# Patient Record
Sex: Male | Born: 1985
Health system: Southern US, Community
[De-identification: ages and names within clinical notes are randomized; demographics above are authoritative.]

---
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
## 2009-02-23 ENCOUNTER — Emergency Department (HOSPITAL_COMMUNITY): Admission: EM | Admit: 2009-02-23 | Discharge: 2009-02-23 | Payer: Self-pay | Admitting: Emergency Medicine

## 2010-03-06 ENCOUNTER — Emergency Department (HOSPITAL_COMMUNITY): Admission: EM | Admit: 2010-03-06 | Discharge: 2010-03-06 | Payer: Self-pay | Admitting: Emergency Medicine

## 2011-10-06 ENCOUNTER — Emergency Department (HOSPITAL_COMMUNITY)
Admission: EM | Admit: 2011-10-06 | Discharge: 2011-10-06 | Disposition: A | Discharge status: Home / Self Care | Payer: Self-pay

## 2011-10-06 NOTE — ED Notes (Signed)
When NT entered room to obtain VS, pt stated he had a family emergency and could not stay to be treated. AMA/elopement form signed in EPIC.

## 2013-12-17 ENCOUNTER — Encounter (HOSPITAL_COMMUNITY): Payer: Self-pay | Admitting: Emergency Medicine

## 2013-12-17 ENCOUNTER — Emergency Department (HOSPITAL_COMMUNITY)
Admission: EM | Admit: 2013-12-17 | Discharge: 2013-12-17 | Disposition: A | Discharge status: Home / Self Care | Payer: No Typology Code available for payment source | Attending: Emergency Medicine | Admitting: Emergency Medicine

## 2013-12-17 DIAGNOSIS — M779 Enthesopathy, unspecified: Secondary | ICD-10-CM

## 2013-12-17 DIAGNOSIS — M25562 Pain in left knee: Secondary | ICD-10-CM

## 2013-12-17 DIAGNOSIS — Y92410 Unspecified street and highway as the place of occurrence of the external cause: Secondary | ICD-10-CM | POA: Insufficient documentation

## 2013-12-17 DIAGNOSIS — S99929A Unspecified injury of unspecified foot, initial encounter: Principal | ICD-10-CM

## 2013-12-17 DIAGNOSIS — S99919A Unspecified injury of unspecified ankle, initial encounter: Principal | ICD-10-CM

## 2013-12-17 DIAGNOSIS — M6580 Other synovitis and tenosynovitis, unspecified site: Secondary | ICD-10-CM | POA: Insufficient documentation

## 2013-12-17 DIAGNOSIS — F17200 Nicotine dependence, unspecified, uncomplicated: Secondary | ICD-10-CM | POA: Insufficient documentation

## 2013-12-17 DIAGNOSIS — Y9389 Activity, other specified: Secondary | ICD-10-CM | POA: Insufficient documentation

## 2013-12-17 DIAGNOSIS — Z79899 Other long term (current) drug therapy: Secondary | ICD-10-CM | POA: Insufficient documentation

## 2013-12-17 DIAGNOSIS — Z791 Long term (current) use of non-steroidal anti-inflammatories (NSAID): Secondary | ICD-10-CM | POA: Insufficient documentation

## 2013-12-17 DIAGNOSIS — S8990XA Unspecified injury of unspecified lower leg, initial encounter: Secondary | ICD-10-CM | POA: Insufficient documentation

## 2013-12-17 MED ORDER — IBUPROFEN 800 MG PO TABS: 800.0000 mg | ORAL_TABLET | Freq: Three times a day (TID) | ORAL | Status: DC

## 2013-12-17 MED ORDER — HYDROCODONE-ACETAMINOPHEN 5-325 MG PO TABS: 1.0000 | ORAL_TABLET | ORAL | Status: DC | PRN

## 2013-12-17 MED ORDER — CYCLOBENZAPRINE HCL 10 MG PO TABS: 10.0000 mg | ORAL_TABLET | Freq: Two times a day (BID) | ORAL | Status: DC | PRN
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
## 2013-12-17 NOTE — Discharge Instructions (Signed)
Cryotherapy °Cryotherapy means treatment with cold. Ice or gel packs can be used to reduce both pain and swelling. Ice is the most helpful within the first 24 to 48 hours after an injury or flareup from overusing a muscle or joint. Sprains, strains, spasms, burning pain, shooting pain, and aches can all be eased with ice. Ice can also be used when recovering from surgery. Ice is effective, has very few side effects, and is safe for most people to use. °PRECAUTIONS  °Ice is not a safe treatment option for people with: °· Raynaud's phenomenon. This is a condition affecting small blood vessels in the extremities. Exposure to cold may cause your problems to return. °· Cold hypersensitivity. There are many forms of cold hypersensitivity, including: °· Cold urticaria. Red, itchy hives appear on the skin when the tissues begin to warm after being iced. °· Cold erythema. This is a red, itchy rash caused by exposure to cold. °· Cold hemoglobinuria. Red blood cells break down when the tissues begin to warm after being iced. The hemoglobin that carry oxygen are passed into the urine because they cannot combine with blood proteins fast enough. °· Numbness or altered sensitivity in the area being iced. °If you have any of the following conditions, do not use ice until you have discussed cryotherapy with your caregiver: °· Heart conditions, such as arrhythmia, angina, or chronic heart disease. °· High blood pressure. °· Healing wounds or open skin in the area being iced. °· Current infections. °· Rheumatoid arthritis. °· Poor circulation. °· Diabetes. °Ice slows the blood flow in the region it is applied. This is beneficial when trying to stop inflamed tissues from spreading irritating chemicals to surrounding tissues. However, if you expose your skin to cold temperatures for too long or without the proper protection, you can damage your skin or nerves. Watch for signs of skin damage due to cold. °HOME CARE INSTRUCTIONS °Follow  these tips to use ice and cold packs safely. °· Place a dry or damp towel between the ice and skin. A damp towel will cool the skin more quickly, so you may need to shorten the time that the ice is used. °· For a more rapid response, add gentle compression to the ice. °· Ice for no more than 10 to 20 minutes at a time. The bonier the area you are icing, the less time it will take to get the benefits of ice. °· Check your skin after 5 minutes to make sure there are no signs of a poor response to cold or skin damage. °· Rest 20 minutes or more in between uses. °· Once your skin is numb, you can end your treatment. You can test numbness by very lightly touching your skin. The touch should be so light that you do not see the skin dimple from the pressure of your fingertip. When using ice, most people will feel these normal sensations in this order: cold, burning, aching, and numbness. °· Do not use ice on someone who cannot communicate their responses to pain, such as small children or people with dementia. °HOW TO MAKE AN ICE PACK °Ice packs are the most common way to use ice therapy. Other methods include ice massage, ice baths, and cryo-sprays. Muscle creams that cause a cold, tingly feeling do not offer the same benefits that ice offers and should not be used as a substitute unless recommended by your caregiver. °To make an ice pack, do one of the following: °· Place crushed ice or   a bag of frozen vegetables in a sealable plastic bag. Squeeze out the excess air. Place this bag inside another plastic bag. Slide the bag into a pillowcase or place a damp towel between your skin and the bag.  Mix 3 parts water with 1 part rubbing alcohol. Freeze the mixture in a sealable plastic bag. When you remove the mixture from the freezer, it will be slushy. Squeeze out the excess air. Place this bag inside another plastic bag. Slide the bag into a pillowcase or place a damp towel between your skin and the bag. SEEK MEDICAL  CARE IF:  You develop white spots on your skin. This may give the skin a blotchy (mottled) appearance.  Your skin turns blue or pale.  Your skin becomes waxy or hard.  Your swelling gets worse. MAKE SURE YOU:   Understand these instructions.  Will watch your condition.  Will get help right away if you are not doing well or get worse. Document Released: 04/26/2011 Document Revised: 11/22/2011 Document Reviewed: 04/26/2011 [facility] CenterExit[facility] Patient Information 2014 [place]ExitCare, [place]. Tendinitis Tendinitis is swelling and inflammation of the tendons. Tendons are band-like tissues that connect muscle to bone. Tendinitis commonly occurs in the:   Shoulders (rotator cuff).  Heels (Achilles tendon).  Elbows (triceps tendon). CAUSES Tendinitis is usually caused by overusing the tendon, muscles, and joints involved. When the tissue surrounding a tendon (synovium) becomes inflamed, it is called tenosynovitis. Tendinitis commonly develops in people whose jobs require repetitive motions. SYMPTOMS  Pain.  Tenderness.  Mild swelling. DIAGNOSIS Tendinitis is usually diagnosed by physical exam. Your caregiver may also order X-rays or other imaging tests. TREATMENT Your caregiver may recommend certain medicines or exercises for your treatment. HOME CARE INSTRUCTIONS   Use a sling or splint for as long as directed by your caregiver until the pain decreases.  Put ice on the injured area.  Put ice in a plastic bag.  Place a towel between your skin and the bag.  Leave the ice on for 15-20 minutes, 03-04 times a day.  Avoid using the limb while the tendon is painful. Perform gentle range of motion exercises only as directed by your caregiver. Stop exercises if pain or discomfort increase, unless directed otherwise by your caregiver.  Only take over-the-counter or prescription medicines for pain, discomfort, or fever as directed by your caregiver. SEEK MEDICAL CARE IF:   Your pain and swelling  increase.  You develop new, unexplained symptoms, especially increased numbness in the hands. MAKE SURE YOU:   Understand these instructions.  Will watch your condition.  Will get help right away if you are not doing well or get worse. Document Released: 08/27/2000 Document Revised: 11/22/2011 Document Reviewed: 11/16/2010 [facility]ExitCare Patient Information 2014 [place]ExitCare, [place]LLC.
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
## 2013-12-18 NOTE — ED Provider Notes (Signed)
Medical screening examination/treatment/procedure(s) were performed by non-physician practitioner and as supervising physician I was immediately available for consultation/collaboration.   Brinlyn Cena, MD 12/18/13 0816 

## 2013-12-19 ENCOUNTER — Emergency Department (HOSPITAL_COMMUNITY): Payer: No Typology Code available for payment source

## 2013-12-19 ENCOUNTER — Emergency Department (HOSPITAL_COMMUNITY)
Admission: EM | Admit: 2013-12-19 | Discharge: 2013-12-20 | Disposition: A | Discharge status: Home / Self Care | Payer: No Typology Code available for payment source | Attending: Emergency Medicine | Admitting: Emergency Medicine

## 2013-12-19 ENCOUNTER — Encounter (HOSPITAL_COMMUNITY): Payer: Self-pay | Admitting: Emergency Medicine

## 2013-12-19 DIAGNOSIS — F17200 Nicotine dependence, unspecified, uncomplicated: Secondary | ICD-10-CM | POA: Insufficient documentation

## 2013-12-19 DIAGNOSIS — Y92410 Unspecified street and highway as the place of occurrence of the external cause: Secondary | ICD-10-CM | POA: Insufficient documentation

## 2013-12-19 DIAGNOSIS — S99929A Unspecified injury of unspecified foot, initial encounter: Principal | ICD-10-CM

## 2013-12-19 DIAGNOSIS — IMO0002 Reserved for concepts with insufficient information to code with codable children: Secondary | ICD-10-CM | POA: Insufficient documentation

## 2013-12-19 DIAGNOSIS — Y9389 Activity, other specified: Secondary | ICD-10-CM | POA: Insufficient documentation

## 2013-12-19 DIAGNOSIS — S8990XA Unspecified injury of unspecified lower leg, initial encounter: Secondary | ICD-10-CM | POA: Insufficient documentation

## 2013-12-19 DIAGNOSIS — S99919A Unspecified injury of unspecified ankle, initial encounter: Principal | ICD-10-CM

## 2013-12-19 DIAGNOSIS — M25562 Pain in left knee: Secondary | ICD-10-CM
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
## 2013-12-19 NOTE — ED Notes (Signed)
Pt arrived to room at this time
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
## 2013-12-20 NOTE — ED Notes (Signed)
Pt discharged with significant other.

## 2013-12-21 NOTE — ED Provider Notes (Signed)
Medical screening examination/treatment/procedure(s) were performed by non-physician practitioner and as supervising physician I was immediately available for consultation/collaboration.   EKG Interpretation None       Jorel Gravlin, MD 12/21/13 0645 

## 2014-01-01 ENCOUNTER — Emergency Department (HOSPITAL_COMMUNITY)
Admission: EM | Admit: 2014-01-01 | Discharge: 2014-01-02 | Disposition: A | Discharge status: Home / Self Care | Payer: No Typology Code available for payment source | Attending: Emergency Medicine | Admitting: Emergency Medicine

## 2014-01-01 DIAGNOSIS — Z87828 Personal history of other (healed) physical injury and trauma: Secondary | ICD-10-CM | POA: Insufficient documentation

## 2014-01-01 DIAGNOSIS — F17200 Nicotine dependence, unspecified, uncomplicated: Secondary | ICD-10-CM | POA: Insufficient documentation

## 2014-01-01 DIAGNOSIS — Z0279 Encounter for issue of other medical certificate: Secondary | ICD-10-CM | POA: Insufficient documentation

## 2014-01-01 DIAGNOSIS — Z0000 Encounter for general adult medical examination without abnormal findings: Secondary | ICD-10-CM
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
## 2014-01-02 ENCOUNTER — Encounter (HOSPITAL_COMMUNITY): Payer: Self-pay | Admitting: Emergency Medicine

## 2014-01-02 NOTE — ED Notes (Signed)
Pt A&OX4, ambulatory at discharge with slow, steady gait, verbalizing no complaints at this time.  

## 2014-01-02 NOTE — Discharge Instructions (Signed)
- Return to the emergency department if you develop any changing/worsening condition or any other concerns  Emergency Department Screening Exam A medical screening exam helps find the cause of your problem. This exam also helps determine if your problem requires treatment right away. Your exam has shown that you do not require immediate emergency treatment. It is safe for you to go to your caregiver's office or clinic for treatment. Plans may have been made for you to be seen by your regular caregiver today. Patients must be treated in an emergency department regardless of their ability to pay. You can decide to stay and receive continued treatment in the emergency department. Some insurance plans may not cover the cost of this service. In some, but not all, states in the [place] the hospital and/or your caregiver might bill you directly for your evaluation and treatment in the emergency department. If your condition worsens or changes in any way, return for re-evaluation or you may go to your caregiver's office or clinic for treatment. Document Released: 11/26/2008 Document Revised: 11/22/2011 Document Reviewed: 11/26/2008 [facility] Patient Information 2014 [place], [place].   Health Maintenance, Males A healthy lifestyle and preventative care can promote health and wellness.  Maintain regular health, dental, and eye exams.  Eat a healthy diet. Foods like vegetables, fruits, whole grains, low-fat dairy products, and lean protein foods contain the nutrients you need and are low in calories. Decrease your intake of foods high in solid fats, added sugars, and salt. Get information about a proper diet from your health care provider, if necessary.  Regular physical exercise is one of the most important things you can do for your health. Most adults should get at least 150 minutes of moderate-intensity exercise (any activity that increases your heart rate and causes you to sweat) each week. In addition, most  adults need muscle-strengthening exercises on 2 or more days a week.   Maintain a healthy weight. The body mass index (BMI) is a screening tool to identify possible weight problems. It provides an estimate of body fat based on height and weight. Your health care provider can find your BMI and can help you achieve or maintain a healthy weight. For males 20 years and older:  A BMI below 18.5 is considered underweight.  A BMI of 18.5 to 24.9 is normal.  A BMI of 25 to 29.9 is considered overweight.  A BMI of 30 and above is considered obese.  Maintain normal blood lipids and cholesterol by exercising and minimizing your intake of saturated fat. Eat a balanced diet with plenty of fruits and vegetables. Blood tests for lipids and cholesterol should begin at age 69 and be repeated every 5 years. If your lipid or cholesterol levels are high, you are over 50, or you are at high risk for heart disease, you may need your cholesterol levels checked more frequently.Ongoing high lipid and cholesterol levels should be treated with medicines, if diet and exercise are not working.  If you smoke, find out from your health care provider how to quit. If you do not use tobacco, do not start.  Lung cancer screening is recommended for adults aged 31 80 years who are at high risk for developing lung cancer because of a history of smoking. A yearly low-dose CT scan of the lungs is recommended for people who have at least a 30-pack-year history of smoking and are a current smoker or have quit within the past 15 years. A pack year of smoking is smoking an  average of 1 pack of cigarettes a day for 1 year (for example, a 30-pack-year history of smoking could mean smoking 1 pack a day for 30 years or 2 packs a day for 15 years). Yearly screening should continue until the smoker has stopped smoking for at least 15 years. Yearly screening should be stopped for people who develop a health problem that would prevent them from  having lung cancer treatment.  If you choose to drink alcohol, do not have more than 2 drinks per day. One drink is considered to be 12 oz (360 mL) of beer, 5 oz (150 mL) of wine, or 1.5 oz (45 mL) of liquor.  Avoid use of street drugs. Do not share needles with anyone. Ask for help if you need support or instructions about stopping the use of drugs.  High blood pressure causes heart disease and increases the risk of stroke. Blood pressure should be checked at least every 1 2 years. Ongoing high blood pressure should be treated with medicines if weight loss and exercise are not effective.  If you are 25 28 years old, ask your health care provider if you should take aspirin to prevent heart disease.  Diabetes screening involves taking a blood sample to check your fasting blood sugar level. This should be done once every 3 years after age 13, if you are at a normal weight and without risk factors for diabetes. Testing should be considered at a younger age or be carried out more frequently if you are overweight and have at least 1 risk factor for diabetes.  Colorectal cancer can be detected and often prevented. Most routine colorectal cancer screening begins at the age of 33 and continues through age 44. However, your health care provider may recommend screening at an earlier age if you have risk factors for colon cancer. On a yearly basis, your health care provider may provide home test kits to check for hidden blood in the stool. A small camera at the end of a tube may be used to directly examine the colon (sigmoidoscopy or colonoscopy) to detect the earliest forms of colorectal cancer. Talk to your health care provider about this at age 62, when routine screening begins. A direct exam of the colon should be repeated every 5 10 years through age 61, unless early forms of pre-cancerous polyps or small growths are found.  People who are at an increased risk for hepatitis B should be screened for this  virus. You are considered at high risk for hepatitis B if:  You were born in a country where hepatitis B occurs often. Talk with your health care provider about which countries are considered high-risk.  Your parents were born in a high-risk country and you have not received a shot to protect against hepatitis B (hepatitis B vaccine).  You have HIV or AIDS.  You use needles to inject street drugs.  You live with, or have sex with, someone who has hepatitis B.  You are a man who has sex with other men (MSM).  You get hemodialysis treatment.  You take certain medicines for conditions like cancer, organ transplantation, and autoimmune conditions.  Hepatitis C blood testing is recommended for all people born from [birth year] through 1965 and any individual with known risk factors for hepatitis C.  Healthy men should no longer receive prostate-specific antigen (PSA) blood tests as part of routine cancer screening. Talk to your health care provider about prostate cancer screening.  Testicular cancer screening is not recommended  for adolescents or adult males who have no symptoms. Screening includes self-exam, a health care provider exam, and other screening tests. Consult with your health care provider about any symptoms you have or any concerns you have about testicular cancer.  Practice safe sex. Use condoms and avoid high-risk sexual practices to reduce the spread of sexually transmitted infections (STIs).  Use sunscreen. Apply sunscreen liberally and repeatedly throughout the day. You should seek shade when your shadow is shorter than you. Protect yourself by wearing long sleeves, pants, a wide-brimmed hat, and sunglasses year round, whenever you are outdoors.  Tell your health care provider of new moles or changes in moles, especially if there is a change in shape or color. Also tell your provider if a mole is larger than the size of a pencil eraser.  A one-time screening for abdominal aortic  aneurysm (AAA) and surgical repair of large AAAs by ultrasound is recommended for men aged 28 75 years who are current or former smokers.  Stay current with your vaccines (immunizations). Document Released: 02/26/2008 Document Revised: 06/20/2013 Document Reviewed: 01/25/2011 [facility] Patient Information 2014 [place], [place].   Emergency Department Resource Guide 1) Find a Doctor and Pay Out of Pocket Although you won't have to find out who is covered by your insurance plan, it is a good idea to ask around and get recommendations. You will then need to call the office and see if the doctor you have chosen will accept you as a new patient and what types of options they offer for patients who are self-pay. Some doctors offer discounts or will set up payment plans for their patients who do not have insurance, but you will need to ask so you aren't surprised when you get to your appointment.  2) Contact Your Local Health Department Not all health departments have doctors that can see patients for sick visits, but many do, so it is worth a call to see if yours does. If you don't know where your local health department is, you can check in your phone book. The CDC also has a tool to help you locate your state's health department, and many state websites also have listings of all of their local health departments.  3) Find a Walk-in Clinic If your illness is not likely to be very severe or complicated, you may want to try a walk in clinic. These are popping up all over the country in pharmacies, drugstores, and shopping centers. They're usually staffed by nurse practitioners or physician assistants that have been trained to treat common illnesses and complaints. They're usually fairly quick and inexpensive. However, if you have serious medical issues or chronic medical problems, these are probably not your best option.  No Primary Care Doctor: - Call Health Connect at  [phone] - they can help you locate  a primary care doctor that  accepts your insurance, provides certain services, etc. - Physician Referral Service- [phone]  Chronic Pain Problems: Organization         Address  Phone   Notes  [facility]  [phone] Patients need to be referred by their primary care doctor.   Medication Assistance: Organization         Address  Phone   Notes  [facility] Medication [facility] [address] [place], [place] [address] [phone]) [phone] --Must be a resident of [place] [facility] -- Must have NO insurance coverage whatsoever (no Medicaid/ Medicare, etc.) -- The pt. MUST have a  primary care doctor that directs their care regularly and follows them in the community   [facility]ist  [phone]   [employer]  [phone]    Agencies that provide inexpensive medical care: Organization         Address  Phone   Notes  [facility]  [phone]) [phone]   [facility]    [phone]) [phone]   [facility] [address] [place], [place] [address] [phone]) [phone]   [facility] [address], [place] [phone]   [facility]anned [facility]    [phone]   [facility]    [phone]   [facility] and [facility]  [address], [place] Phone:  [phone], Fax:  [phone]) [phone] Hours of Operation:  9 am - 6 pm, M-F.  Also accepts Medicaid/Medicare and self-pay.  [facility]  [address], [place] Phone: [phone](336) [phone], Fax: [phone](336) [phone]. Hours of Operation:  8:30 am - 5:30 pm, M-F.  Also accepts Medicaid and self-pay.  [facility] [place] [address], [place] Phone: [phone](336) [phone]   [facility] [address], [place], [place] [phone], Ext. [phone] Mondays & Thursdays: 7-9 AM.  First 15 patients are seen on a first come, first serve basis.    Medicaid-accepting [facility]  Providers:  Organization         Address  Phone   Notes  [facility] [address], [place] [phone]) [phone] Also accepts self-pay patients.  [facility] [address], [place]  [phone](336) [phone]   [facility] [address], [place] [phone]   [facility] [address] [place] [address], [place] [phone](336) [phone]   Renaye RakersVeita Bland [address], [place]   [phone]) [phone] Only accepts [place] Access [place] patients after they have their name applied to their card.   Self-Pay (no insurance) in [facility] [place]:  Organization         Address  Phone   Notes  Sickle Cell Patients, [facility] [address], [place] [phone]) [phone]   [facility] [address], [place] [phone]   [facility] [place]  [address],  [phone]) [phone]   [facility]/Dr. Osei-Bonsu  [address] [place] [address], [place] or [phone], [place] [phone] Phone number for both [place] and [place] locations is the same.  [facility] [address], [place] [phone]) [phone]   [facility]  [address] [place] [address], [place] or [address] [phone]   [facility] [address], [place] [phone]) [phone], phone; [phone](336) [phone], fax Sees patients 1st and 3rd Saturday of every month.  Must not qualify for public or private insurance (i.e. Medicaid, Medicare, [place] Health Choice, Veterans' Benefits)  Household income should be no more than 200% of the poverty level The clinic cannot treat you if you are pregnant or think you are pregnant  Sexually transmitted diseases are not treated at the clinic.    Dental Care: Organization         Address  Phone  Notes  [facility] [address], [place] [phone] Accepts children up to age 79 who are enrolled in Medicaid or [place] Health Choice; pregnant women with a Medicaid card; and children who have applied for Medicaid or [place] Health Choice, but were declined, whose parents can pay a reduced fee at time of service.  [facility] of [facility]  [address], [place] [phone] Accepts children up to age 59 who are enrolled in [place] or [place] Health Choice; pregnant women with a Medicaid card; and children who have applied for Medicaid or  Health Choice, but were declined, whose parents can pay a reduced fee at time of service.  [facility]  [address] [place], [place] [phone] Patients are seen by appointment only. Walk-ins are not accepted. [facility] will see patients 7 years of age and older. Monday - Tuesday (8am-5pm) Most Wednesdays (8:30-5pm) $30 per visit, cash only  [facility] Adult Dental Access PROGRAM  [address], [facility] [phone] Patients are seen by appointment only. Walk-ins are not accepted. [facility] will see patients 46 years of age and older. One Wednesday Evening (Monthly: Volunteer Based).  $30 per visit, cash only  [employer]  [phone] for adults; Children under age 58, call Graduate Pediatric Dentistry at [phone]. Children aged 78-14, please call [phone] to request a pediatric application.  Dental services are provided in all areas of dental care including fillings, crowns and bridges, complete and partial dentures, implants, gum treatment, root canals, and extractions. Preventive care is also provided. Treatment is provided to both adults and children. Patients are selected via a lottery and there is often a waiting list.   [facility] [address] [place]  [phone] [email or web address]   [facility]  [address] [place], [place] [phone], Ext. [phone] Second and Fourth Thursday of each month, opens at 6:30 AM; Clinic ends at 9 AM.  Patients are seen on a first-come first-served basis, and a limited number are seen during each clinic.   [facility]  [address] [place], [place] [phone]   Eligibility Requirements You must have lived in [place], [place], or [place] counties for at least the last three months.   You cannot be eligible for state or federal sponsored [place], including [employer], [place], or [employer].   You generally cannot be eligible for healthcare insurance through your employer.    How to apply: Eligibility screenings are held every Tuesday and Wednesday afternoon from 1:00 pm until 4:00 pm. You do not need an appointment for the interview!  [facility] [address] [place], [place] [phone]   [facility] Health Department  [phone]   [facility] Health Department  [phone]   [facility] Health Department  [phone]    Behavioral Health Resources in the Community: Intensive Outpatient Programs Organization         Address  Phone  Notes  [facility] [address] [place], [place] [phone]   [facility]patient [address] [place], [place] [phone]   ADS: Alcohol & Drug Svcs [address], [place], [place]  [phone]   [facility] Mental Health [address],  [place], [place] [phone] or [phone]   Substance Abuse Resources Organization         Address  Phone  Notes  Alcohol and Drug Services  [phone]  [facility]  [phone]   [facility] [place]  [phone]   Floydene Flock  [phone]   Residential & Outpatient Substance Abuse Program  [phone]   Psychological Services Organization         Address  Phone  Notes  [facility]al Health  [phone]     [facility]  [phone]   [facility] [address] [place] [phone] or [phone]    Mobile Crisis Teams Organization         Address  Phone  Notes  [facility], Mobile Crisis Care Unit  [phone]   [facility]  [address] [place], [place] [phone]   Doristine Locks [address] [place] [phone]    Self-Help/Support Groups Organization         Address  Phone             Notes  [facility] [place] - variety of support groups  [phone] Call for more information  Narcotics Anonymous (NA), [facility] [address], [employer] [place]  2 meetings at this location   Statistician         Address  Phone  Notes  [facility] [address],    [place]  [phone]   [facility]  [address] [place] [address], [place], [place] [phone]   [facility] [address] [place], [place] [phone] Admissions: 8am-3pm M-F  [facility] [address]    [place], [place] [phone]   [facility] [address] [place], [place], [place] [phone]   [facility] [address]  [place], [place] [phone]   [facility] - Intensive Outpatient [address], [place], [place] [phone]   [facility] ([facility] [address]  [place], [place] [phone] or [phone]   [facility] ([facility]) [address], [place], [place] [phone] Accepts Medicaid  [facility] [place] [address],  [place] [phone] Substance Abuse/Addiction Treatment   [facility] Organization         Address  Phone  Notes  [facility]  [phone]   Angie Fava, PhD [address] [place], [place]   [phone] or [phone]    [facility] Behavioral   [address] [place], [place] [phone]   [facility] [address] [place], [place] [phone] Insurance/Medicaid/sponsorship through [facility] and Families [address]                                    [place], [place] [phone] Therapy/tele-psych/case  [facility] [address] [place], [place] [phone]    Dr. Lolly Mustache  [phone]   [facility]  [employer] [facility] Dept. 1) [address] [place] 2) [address] [place] 3)  [address], [place] [phone]  [phone]   [facility] Child Abuse Hotline [phone]) [phone] or [phone] (After Hours)
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
## 2014-01-14 NOTE — ED Provider Notes (Addendum)
Medical screening examination/treatment/procedure(s) were performed by non-physician practitioner and as supervising physician I was immediately available for consultation/collaboration.   EKG Interpretation None       Derwood KaplanAnkit Printice Hellmer, MD 01/14/14 1746  Derwood KaplanAnkit Skyra Crichlow, MD 01/15/14 1859

## 2018-05-02 ENCOUNTER — Emergency Department (HOSPITAL_BASED_OUTPATIENT_CLINIC_OR_DEPARTMENT_OTHER): Payer: No Typology Code available for payment source

## 2018-05-02 ENCOUNTER — Emergency Department (HOSPITAL_BASED_OUTPATIENT_CLINIC_OR_DEPARTMENT_OTHER)
Admission: EM | Admit: 2018-05-02 | Discharge: 2018-05-02 | Disposition: A | Discharge status: Home / Self Care | Payer: No Typology Code available for payment source | Attending: Emergency Medicine | Admitting: Emergency Medicine

## 2018-05-02 ENCOUNTER — Other Ambulatory Visit: Payer: Self-pay

## 2018-05-02 ENCOUNTER — Encounter (HOSPITAL_BASED_OUTPATIENT_CLINIC_OR_DEPARTMENT_OTHER): Payer: Self-pay | Admitting: *Deleted

## 2018-05-02 DIAGNOSIS — Y9389 Activity, other specified: Secondary | ICD-10-CM | POA: Insufficient documentation

## 2018-05-02 DIAGNOSIS — Y92410 Unspecified street and highway as the place of occurrence of the external cause: Secondary | ICD-10-CM | POA: Diagnosis not present

## 2018-05-02 DIAGNOSIS — L739 Follicular disorder, unspecified: Secondary | ICD-10-CM | POA: Diagnosis not present

## 2018-05-02 DIAGNOSIS — S39012A Strain of muscle, fascia and tendon of lower back, initial encounter: Secondary | ICD-10-CM | POA: Diagnosis not present

## 2018-05-02 DIAGNOSIS — R21 Rash and other nonspecific skin eruption: Secondary | ICD-10-CM | POA: Diagnosis not present

## 2018-05-02 DIAGNOSIS — Y998 Other external cause status: Secondary | ICD-10-CM | POA: Insufficient documentation

## 2018-05-02 DIAGNOSIS — S3992XA Unspecified injury of lower back, initial encounter: Secondary | ICD-10-CM | POA: Diagnosis present

## 2018-05-02 DIAGNOSIS — F17210 Nicotine dependence, cigarettes, uncomplicated: Secondary | ICD-10-CM | POA: Insufficient documentation

## 2018-05-02 MED ORDER — CYCLOBENZAPRINE HCL 10 MG PO TABS: 10.0000 mg | ORAL_TABLET | Freq: Two times a day (BID) | ORAL | 0 refills | Status: AC | PRN

## 2018-05-02 MED ORDER — NAPROXEN 500 MG PO TABS: 500.0000 mg | ORAL_TABLET | Freq: Two times a day (BID) | ORAL | 0 refills | Status: AC

## 2018-05-02 MED ORDER — DOXYCYCLINE HYCLATE 100 MG PO CAPS: 100.0000 mg | ORAL_CAPSULE | Freq: Two times a day (BID) | ORAL | 0 refills | Status: AC

## 2018-05-02 MED ORDER — LIDOCAINE 5 % EX PTCH: 1.0000 | MEDICATED_PATCH | CUTANEOUS | 0 refills | Status: AC

## 2018-05-02 MED FILL — CYCLOBENZAPRINE HCL 10 MG T: 10 | 10 days supply | Qty: 20 | Fill #0

## 2018-05-02 MED FILL — NAPROXEN 500 MG TABLET: 500 | 15 days supply | Qty: 30 | Fill #0

## 2018-05-02 MED FILL — DOXYCYCLINE HYCLATE 100 MG: 100 | 7 days supply | Qty: 14 | Fill #0
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
## 2018-05-02 NOTE — Discharge Instructions (Signed)
Naprosyn, Flexeril for pain and muscle spasms.  Try lidocaine patches for pain control.  Take doxycycline for folliculitis until all gone.  Try antibacterial soap.  Follow-up with family doctor as needed.  Return if worsening symptoms.
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
## 2019-05-22 ENCOUNTER — Other Ambulatory Visit: Payer: Self-pay

## 2019-05-22 DIAGNOSIS — Z20822 Contact with and (suspected) exposure to covid-19: Secondary | ICD-10-CM

## 2019-05-24 ENCOUNTER — Telehealth: Payer: Self-pay | Admitting: *Deleted

## 2019-05-24 LAB — NOVEL CORONAVIRUS, NAA: SARS-CoV-2, NAA: NOT DETECTED

## 2019-05-24 NOTE — Telephone Encounter (Signed)
See results note 05/24/19.

## 2019-08-08 ENCOUNTER — Other Ambulatory Visit: Payer: Self-pay

## 2019-08-08 DIAGNOSIS — Z20822 Contact with and (suspected) exposure to covid-19: Secondary | ICD-10-CM

## 2019-08-09 LAB — NOVEL CORONAVIRUS, NAA: SARS-CoV-2, NAA: NOT DETECTED

## 2020-04-09 ENCOUNTER — Ambulatory Visit: Payer: Self-pay | Attending: Internal Medicine

## 2020-04-09 DIAGNOSIS — Z20822 Contact with and (suspected) exposure to covid-19: Secondary | ICD-10-CM

## 2020-04-10 LAB — NOVEL CORONAVIRUS, NAA: SARS-CoV-2, NAA: NOT DETECTED

## 2020-04-10 LAB — SARS-COV-2, NAA 2 DAY TAT

## 2020-05-20 ENCOUNTER — Ambulatory Visit (HOSPITAL_COMMUNITY): Payer: Self-pay
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
